# Patient Record
Sex: Female | Born: 1970 | Race: Black or African American | Hispanic: No | Smoking: Current every day smoker
Health system: Southern US, Community
[De-identification: ages and names within clinical notes are randomized; demographics above are authoritative.]

## PROBLEM LIST (undated history)

## (undated) DIAGNOSIS — R569 Unspecified convulsions: Secondary | ICD-10-CM

---

## 2008-12-10 ENCOUNTER — Emergency Department (HOSPITAL_COMMUNITY): Admission: EM | Admit: 2008-12-10 | Discharge: 2008-12-10 | Payer: Self-pay | Admitting: Emergency Medicine

## 2010-07-14 ENCOUNTER — Emergency Department (HOSPITAL_COMMUNITY)
Admission: EM | Admit: 2010-07-14 | Discharge: 2010-07-14 | Disposition: A | Discharge status: Home / Self Care | Payer: Self-pay | Attending: Emergency Medicine | Admitting: Emergency Medicine

## 2010-07-14 DIAGNOSIS — B9689 Other specified bacterial agents as the cause of diseases classified elsewhere: Secondary | ICD-10-CM | POA: Insufficient documentation

## 2010-07-14 DIAGNOSIS — R109 Unspecified abdominal pain: Secondary | ICD-10-CM | POA: Insufficient documentation

## 2010-07-14 DIAGNOSIS — N760 Acute vaginitis: Secondary | ICD-10-CM | POA: Insufficient documentation

## 2010-07-14 DIAGNOSIS — A499 Bacterial infection, unspecified: Secondary | ICD-10-CM | POA: Insufficient documentation

## 2010-07-14 LAB — URINALYSIS, ROUTINE W REFLEX MICROSCOPIC
Leukocytes, UA: NEGATIVE
Nitrite: NEGATIVE
Protein, ur: NEGATIVE mg/dL
Specific Gravity, Urine: 1.02 (ref 1.005–1.030)
Urobilinogen, UA: 0.2 mg/dL (ref 0.0–1.0)

## 2010-07-14 LAB — WET PREP, GENITAL: Trich, Wet Prep: NONE SEEN

## 2010-07-14 LAB — URINE MICROSCOPIC-ADD ON

## 2016-02-08 ENCOUNTER — Emergency Department (HOSPITAL_COMMUNITY)
Admission: EM | Admit: 2016-02-08 | Discharge: 2016-02-08 | Disposition: A | Discharge status: Home / Self Care | Payer: Self-pay | Attending: Emergency Medicine | Admitting: Emergency Medicine

## 2016-02-08 ENCOUNTER — Encounter (HOSPITAL_COMMUNITY): Payer: Self-pay | Admitting: Emergency Medicine

## 2016-02-08 ENCOUNTER — Emergency Department (HOSPITAL_COMMUNITY): Payer: Self-pay

## 2016-02-08 DIAGNOSIS — X58XXXA Exposure to other specified factors, initial encounter: Secondary | ICD-10-CM | POA: Insufficient documentation

## 2016-02-08 DIAGNOSIS — J219 Acute bronchiolitis, unspecified: Secondary | ICD-10-CM | POA: Insufficient documentation

## 2016-02-08 DIAGNOSIS — S93401A Sprain of unspecified ligament of right ankle, initial encounter: Secondary | ICD-10-CM | POA: Insufficient documentation

## 2016-02-08 DIAGNOSIS — Y999 Unspecified external cause status: Secondary | ICD-10-CM | POA: Insufficient documentation

## 2016-02-08 DIAGNOSIS — Y929 Unspecified place or not applicable: Secondary | ICD-10-CM | POA: Insufficient documentation

## 2016-02-08 DIAGNOSIS — F17210 Nicotine dependence, cigarettes, uncomplicated: Secondary | ICD-10-CM | POA: Insufficient documentation

## 2016-02-08 DIAGNOSIS — Y939 Activity, unspecified: Secondary | ICD-10-CM | POA: Insufficient documentation

## 2016-02-08 DIAGNOSIS — Z720 Tobacco use: Secondary | ICD-10-CM

## 2016-02-08 MED ORDER — ACETAMINOPHEN 325 MG PO TABS
650.0000 mg | ORAL_TABLET | Freq: Once | ORAL | Status: AC
  Administered 2016-02-08: 650 mg via ORAL
  Filled 2016-02-08: qty 2

## 2016-02-08 MED ORDER — PREDNISONE 10 MG PO TABS: 20.0000 mg | ORAL_TABLET | Freq: Two times a day (BID) | ORAL | 0 refills | Status: DC

## 2016-02-08 MED ORDER — AEROCHAMBER Z-STAT PLUS/MEDIUM MISC: 1.0000 | Freq: Once | Status: DC

## 2016-02-08 MED ORDER — PREDNISONE 10 MG PO TABS
60.0000 mg | ORAL_TABLET | Freq: Once | ORAL | Status: AC
  Administered 2016-02-08: 60 mg via ORAL
  Filled 2016-02-08: qty 1

## 2016-02-08 MED ORDER — IPRATROPIUM BROMIDE 0.02 % IN SOLN
0.5000 mg | Freq: Once | RESPIRATORY_TRACT | Status: AC
  Administered 2016-02-08: 0.5 mg via RESPIRATORY_TRACT
  Filled 2016-02-08: qty 2.5

## 2016-02-08 MED ORDER — DOXYCYCLINE HYCLATE 100 MG PO CAPS: 100.0000 mg | ORAL_CAPSULE | Freq: Two times a day (BID) | ORAL | 0 refills | Status: DC

## 2016-02-08 MED ORDER — ALBUTEROL SULFATE (2.5 MG/3ML) 0.083% IN NEBU
5.0000 mg | INHALATION_SOLUTION | Freq: Once | RESPIRATORY_TRACT | Status: AC
  Administered 2016-02-08: 5 mg via RESPIRATORY_TRACT
  Filled 2016-02-08: qty 6

## 2016-02-08 MED ORDER — ALBUTEROL SULFATE HFA 108 (90 BASE) MCG/ACT IN AERS
2.0000 | INHALATION_SPRAY | Freq: Once | RESPIRATORY_TRACT | Status: AC
  Administered 2016-02-08: 2 via RESPIRATORY_TRACT
  Filled 2016-02-08: qty 6.7

## 2016-02-08 NOTE — ED Notes (Signed)
Pt tolerating fluids at this time.  

## 2016-02-08 NOTE — ED Triage Notes (Signed)
Pt reports productive cough with thick yellowish sputum x 3 days. Pt also c/o headache and R foot pain and edema.

## 2016-02-08 NOTE — ED Notes (Signed)
Patient returned from X-ray 

## 2016-02-08 NOTE — ED Notes (Signed)
Pt complains of productive cough starting 3 days ago with yellow sputum, sneezing. Pt states a headache that started 1 day ago. Pt complains of R foot pain and swelling. Pt also complains of R great toe feeling numb.

## 2016-02-08 NOTE — Discharge Instructions (Signed)
Use the inhaler 2 puffs every 4 hours, for trouble breathing or cough.  It is important to stop smoking.  Follow-up with a primary care doctor if you are not better in one or 2 weeks.  Use the brace on your right ankle as needed for discomfort and swelling.

## 2016-02-08 NOTE — ED Provider Notes (Signed)
[facility]-EMERGENCY DEPT Provider Note   CSN: [id number] Arrival date & time: 02/08/16  1219  By signing my name below, I, Placido Sou, attest that this documentation has been prepared under the direction and in the presence of Mancel Bale, MD. Electronically Signed: Placido Sou, ED Scribe. 02/08/16. 3:55 PM.   History   Chief Complaint Chief Complaint  Patient presents with  . Cough    HPI HPI Comments: Mariah Bonilla is a 45 y.o. female who presents to the Emergency Department complaining of Cough for 2 weeks, productive of green tea L sputum. She denies fever, chills, nausea, vomiting, weakness or dizziness. She complains of swelling of the right ankle for several days without known trauma. She is a cigarette smoker. There are no other known modifying factors.    The history is provided by the patient. No language interpreter was used.    History reviewed. No pertinent past medical history.  There are no active problems to display for this patient.   History reviewed. No pertinent surgical history.  OB History    Gravida Para Term Preterm AB Living   2 2 2          SAB TAB Ectopic Multiple Live Births                   Home Medications    Prior to Admission medications   Medication Sig Start Date End Date Taking? Authorizing Provider  doxycycline (VIBRAMYCIN) 100 MG capsule Take 1 capsule (100 mg total) by mouth 2 (two) times daily. 02/08/16   Mancel Bale, MD  predniSONE (DELTASONE) 10 MG tablet Take 2 tablets (20 mg total) by mouth 2 (two) times daily. 02/08/16   Mancel Bale, MD    Family History Family History  Problem Relation Age of Onset  . Diabetes Other   . Cancer Other   . Hypertension Other     Social History Social History  Substance Use Topics  . Smoking status: Current Every Day Smoker    Packs/day: 0.25    Types: Cigarettes  . Smokeless tobacco: Never Used  . Alcohol use No     Allergies   Review of patient's allergies indicates  no known allergies.   Review of Systems Review of Systems  All other systems reviewed and are negative.  Physical Exam Updated Vital Signs BP 109/78   Pulse 87   Temp 98.8 F (37.1 C) (Oral)   Resp 20   Ht 5\' 2"  (1.575 m)   Wt 170 lb (77.1 kg)   LMP 02/08/2016   SpO2 99%   BMI 31.09 kg/m   Physical Exam  Constitutional: She is oriented to person, place, and time. She appears well-developed and well-nourished.  HENT:  Head: Normocephalic and atraumatic.  Eyes: Conjunctivae and EOM are normal. Pupils are equal, round, and reactive to light.  Neck: Normal range of motion and phonation normal. Neck supple.  Cardiovascular: Normal rate and regular rhythm.   Pulmonary/Chest: Effort normal. She exhibits no tenderness.  Decreased edema bilaterally with scattered rhonchi and wheezes. No increased work of breathing.  Abdominal: Soft. She exhibits no distension. There is no tenderness. There is no guarding.  Musculoskeletal: Normal range of motion.  Mild tenderness and swelling right lateral ankle region. Right ankle is stable to stress.  Neurological: She is alert and oriented to person, place, and time. She exhibits normal muscle tone.  Skin: Skin is warm and dry.  Psychiatric: She has a normal mood and affect. Her behavior is normal.  Judgment and thought content normal.  Nursing note and vitals reviewed.    ED Treatments / Results  Labs (all labs ordered are listed, but only abnormal results are displayed) Labs Reviewed - No data to display  EKG  EKG Interpretation None       Radiology Dg Chest 2 View  Result Date: 02/08/2016 CLINICAL DATA:  Productive cough for 3 days. EXAM: CHEST  2 VIEW COMPARISON:  None. FINDINGS: The heart size and mediastinal contours are within normal limits. Both lungs are clear. No pneumothorax or pleural effusion is noted. The visualized skeletal structures are unremarkable. IMPRESSION: No active cardiopulmonary disease. Electronically Signed    By: Lupita Raider, M.D.   On: 02/08/2016 13:55    Procedures Procedures  DIAGNOSTIC STUDIES: Oxygen Saturation is 93% on RA, adequate by my interpretation.    COORDINATION OF CARE: 3:55 PM Discussed next steps with pt. Pt verbalized understanding and is agreeable with the plan.    Medications Ordered in ED Medications  albuterol (PROVENTIL HFA;VENTOLIN HFA) 108 (90 Base) MCG/ACT inhaler 2 puff (not administered)  aerochamber Z-Stat Plus/medium 1 each (not administered)  albuterol (PROVENTIL) (2.5 MG/3ML) 0.083% nebulizer solution 5 mg (5 mg Nebulization Given 02/08/16 1354)  ipratropium (ATROVENT) nebulizer solution 0.5 mg (0.5 mg Nebulization Given 02/08/16 1354)  predniSONE (DELTASONE) tablet 60 mg (60 mg Oral Given 02/08/16 1323)  acetaminophen (TYLENOL) tablet 650 mg (650 mg Oral Given 02/08/16 1323)     Initial Impression / Assessment and Plan / ED Course  I have reviewed the triage vital signs and the nursing notes.  Pertinent labs & imaging results that were available during my care of the patient were reviewed by me and considered in my medical decision making (see chart for details).  Clinical Course   Medications  albuterol (PROVENTIL HFA;VENTOLIN HFA) 108 (90 Base) MCG/ACT inhaler 2 puff (not administered)  aerochamber Z-Stat Plus/medium 1 each (not administered)  albuterol (PROVENTIL) (2.5 MG/3ML) 0.083% nebulizer solution 5 mg (5 mg Nebulization Given 02/08/16 1354)  ipratropium (ATROVENT) nebulizer solution 0.5 mg (0.5 mg Nebulization Given 02/08/16 1354)  predniSONE (DELTASONE) tablet 60 mg (60 mg Oral Given 02/08/16 1323)  acetaminophen (TYLENOL) tablet 650 mg (650 mg Oral Given 02/08/16 1323)    Patient Vitals for the past 24 hrs:  BP Temp Temp src Pulse Resp SpO2 Height Weight  02/08/16 1400 109/78 - - 87 20 99 % - -  02/08/16 1355 - - - - - 100 % - -  02/08/16 1229 - - - - - - - 170 lb (77.1 kg)  02/08/16 1228 - - - - - - 5\' 2"  (1.575 m) -  02/08/16 1226  137/78 98.8 F (37.1 C) Oral 102 18 93 % - -    3:54 PM Reevaluation with update and discussion. After initial assessment and treatment, an updated evaluation reveals She is feeling better at this time. Findings discussed with patient and son, all questions answered. Caius Silbernagel L     I personally performed the services described in this documentation, which was scribed in my presence. The recorded information has been reviewed and is accurate   Final Clinical Impressions(s) / ED Diagnoses   Final diagnoses:  Acute bronchiolitis due to unspecified organism  Tobacco abuse  Right ankle sprain, initial encounter    Evaluation consistent with tobacco related, acute bronchitis. Doubt pneumonia, sepsis or PE. Incidental right ankle swelling likely related to occult strain.  Nursing Notes Reviewed/ Care Coordinated Applicable Imaging Reviewed Interpretation of Laboratory  Data incorporated into ED treatment  The patient appears reasonably screened and/or stabilized for discharge and I doubt any other medical condition or other [facility] requiring further screening, evaluation, or treatment in the ED at this time prior to discharge.  Plan: Home Medications- continue; Home Treatments- rest, stop smoking; return here if the recommended treatment, does not improve the symptoms; Recommended follow up- PCP prn   New Prescriptions New Prescriptions   DOXYCYCLINE (VIBRAMYCIN) 100 MG CAPSULE    Take 1 capsule (100 mg total) by mouth 2 (two) times daily.   PREDNISONE (DELTASONE) 10 MG TABLET    Take 2 tablets (20 mg total) by mouth 2 (two) times daily.     Mancel BaleElliott Jari Carollo, MD 02/08/16 [phone]

## 2016-02-09 NOTE — Progress Notes (Signed)
Late entry- Respiratory Therapist did instruction patient on the use of Albuterol MDI with aerochamber (dose was administered) at 1613 on 02/08/16. Patient was able to demonstrate her understanding of its use with good technique and effort.

## 2016-12-17 ENCOUNTER — Emergency Department (HOSPITAL_COMMUNITY)
Admission: EM | Admit: 2016-12-17 | Discharge: 2016-12-17 | Disposition: A | Discharge status: Home / Self Care | Payer: Self-pay | Attending: Emergency Medicine | Admitting: Emergency Medicine

## 2016-12-17 ENCOUNTER — Encounter (HOSPITAL_COMMUNITY): Payer: Self-pay | Admitting: Cardiology

## 2016-12-17 DIAGNOSIS — F17210 Nicotine dependence, cigarettes, uncomplicated: Secondary | ICD-10-CM | POA: Insufficient documentation

## 2016-12-17 DIAGNOSIS — Z5321 Procedure and treatment not carried out due to patient leaving prior to being seen by health care provider: Secondary | ICD-10-CM | POA: Insufficient documentation

## 2016-12-17 DIAGNOSIS — K1370 Unspecified lesions of oral mucosa: Secondary | ICD-10-CM | POA: Insufficient documentation

## 2016-12-17 NOTE — ED Triage Notes (Signed)
Sore under tongue since this morning.

## 2016-12-17 NOTE — ED Notes (Addendum)
Pt has purple blood vessel under tongue. Denies injury to pain at this time. States pain occurs when lifting tongue high in her mouth. Airway intact and open

## 2016-12-17 NOTE — ED Notes (Signed)
Per Anette RiedelAnn Hunt, RN pt left prior to d/c instructions given.

## 2017-09-21 ENCOUNTER — Emergency Department (HOSPITAL_COMMUNITY)
Admission: EM | Admit: 2017-09-21 | Discharge: 2017-09-21 | Disposition: A | Discharge status: Home / Self Care | Payer: BLUE CROSS/BLUE SHIELD | Attending: Emergency Medicine | Admitting: Emergency Medicine

## 2017-09-21 ENCOUNTER — Encounter (HOSPITAL_COMMUNITY): Payer: Self-pay | Admitting: Emergency Medicine

## 2017-09-21 DIAGNOSIS — K047 Periapical abscess without sinus: Secondary | ICD-10-CM | POA: Diagnosis not present

## 2017-09-21 DIAGNOSIS — K0889 Other specified disorders of teeth and supporting structures: Secondary | ICD-10-CM | POA: Diagnosis present

## 2017-09-21 DIAGNOSIS — F17210 Nicotine dependence, cigarettes, uncomplicated: Secondary | ICD-10-CM | POA: Insufficient documentation

## 2017-09-21 MED ORDER — CLINDAMYCIN HCL 150 MG PO CAPS
300.0000 mg | ORAL_CAPSULE | Freq: Once | ORAL | Status: AC
  Administered 2017-09-21: 300 mg via ORAL
  Filled 2017-09-21: qty 2

## 2017-09-21 MED ORDER — CLINDAMYCIN HCL 300 MG PO CAPS: 300.0000 mg | ORAL_CAPSULE | Freq: Four times a day (QID) | ORAL | 0 refills | Status: DC
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
## 2017-09-21 NOTE — Discharge Instructions (Addendum)
Return if any problems.

## 2017-09-21 NOTE — ED Provider Notes (Signed)
[facility] EMERGENCY DEPARTMENT Provider Note   CSN: [id number] Arrival date & time: 09/21/17  1647     History   Chief Complaint Chief Complaint  Patient presents with  . Dental Pain    HPI Mariah Bonilla is a 47 y.o. female.  The history is provided by the patient. No language interpreter was used.  Dental Pain   This is a new problem. The current episode started more than 2 days ago. The problem occurs constantly. The problem has been gradually worsening. The pain is at a severity of 4/10. The pain is moderate. She has tried nothing for the symptoms.  Pt complains of swelling to her lower jaw.  Pt reports area has been oozing today. Pt request antibiotics.    History reviewed. No pertinent past medical history.  There are no active problems to display for this patient.   History reviewed. No pertinent surgical history.   OB History    Gravida  2   Para  2   Term  2   Preterm      AB      Living        SAB      TAB      Ectopic      Multiple      Live Births               Home Medications    Prior to Admission medications   Medication Sig Start Date End Date Taking? Authorizing Provider  clindamycin (CLEOCIN) 300 MG capsule Take 1 capsule (300 mg total) by mouth every 6 (six) hours. 09/21/17   Elson Areas, PA-C  doxycycline (VIBRAMYCIN) 100 MG capsule Take 1 capsule (100 mg total) by mouth 2 (two) times daily. 02/08/16   Mancel Bale, MD  predniSONE (DELTASONE) 10 MG tablet Take 2 tablets (20 mg total) by mouth 2 (two) times daily. 02/08/16   Mancel Bale, MD    Family History Family History  Problem Relation Age of Onset  . Diabetes Other   . Cancer Other   . Hypertension Other     Social History Social History   Tobacco Use  . Smoking status: Current Every Day Smoker    Packs/day: 0.25    Types: Cigarettes  . Smokeless tobacco: Never Used  Substance Use Topics  . Alcohol use: Yes    Comment: rarely  . Drug use: No      Allergies   Patient has no known allergies.   Review of Systems Review of Systems  All other systems reviewed and are negative.    Physical Exam Updated Vital Signs BP (!) 146/97 (BP Location: Right Arm)   Pulse (!) 103   Temp 98.8 F (37.1 C) (Oral)   Resp 16   Ht  (1.6 m)   Wt 62.6 kg (138 lb)   LMP 09/07/2017   SpO2 100%   BMI 24.45 kg/m   Physical Exam  Constitutional: She appears well-developed and well-nourished.  HENT:  Head: Normocephalic.  Swelling lower gumline,   Cardiovascular: Normal rate.  Pulmonary/Chest: Effort normal.  Musculoskeletal: Normal range of motion.  Neurological: She is alert.  Skin: Skin is warm.  Psychiatric: She has a normal mood and affect.  Nursing note and vitals reviewed.    ED Treatments / Results  Labs (all labs ordered are listed, but only abnormal results are displayed) Labs Reviewed - No data to display  EKG None  Radiology No results found.  Procedures Procedures (  including critical care time)  Medications Ordered in ED Medications  clindamycin (CLEOCIN) capsule 300 mg (300 mg Oral Given 09/21/17 1934)     Initial Impression / Assessment and Plan / ED Course  I have reviewed the triage vital signs and the nursing notes.  Pertinent labs & imaging results that were available during my care of the patient were reviewed by me and considered in my medical decision making (see chart for details).    Pt given resource guide for dentistry.     Final Clinical Impressions(s) / ED Diagnoses   Final diagnoses:  Dental infection    ED Discharge Orders        Ordered    clindamycin (CLEOCIN) 300 MG capsule  Every 6 hours     09/21/17 1926    An After Visit Summary was printed and given to the patient.   Elson Areas, Cordelia Poche 09/21/17 2001    Doug Sou, MD 09/22/17 [phone]

## 2018-03-05 IMAGING — DX DG CHEST 2V
2 series · 2 of 2 positions shown · non-contrast
Comparison: None.

CLINICAL DATA: Productive cough for 3 days.

EXAM:
CHEST  2 VIEW

[chest pa]
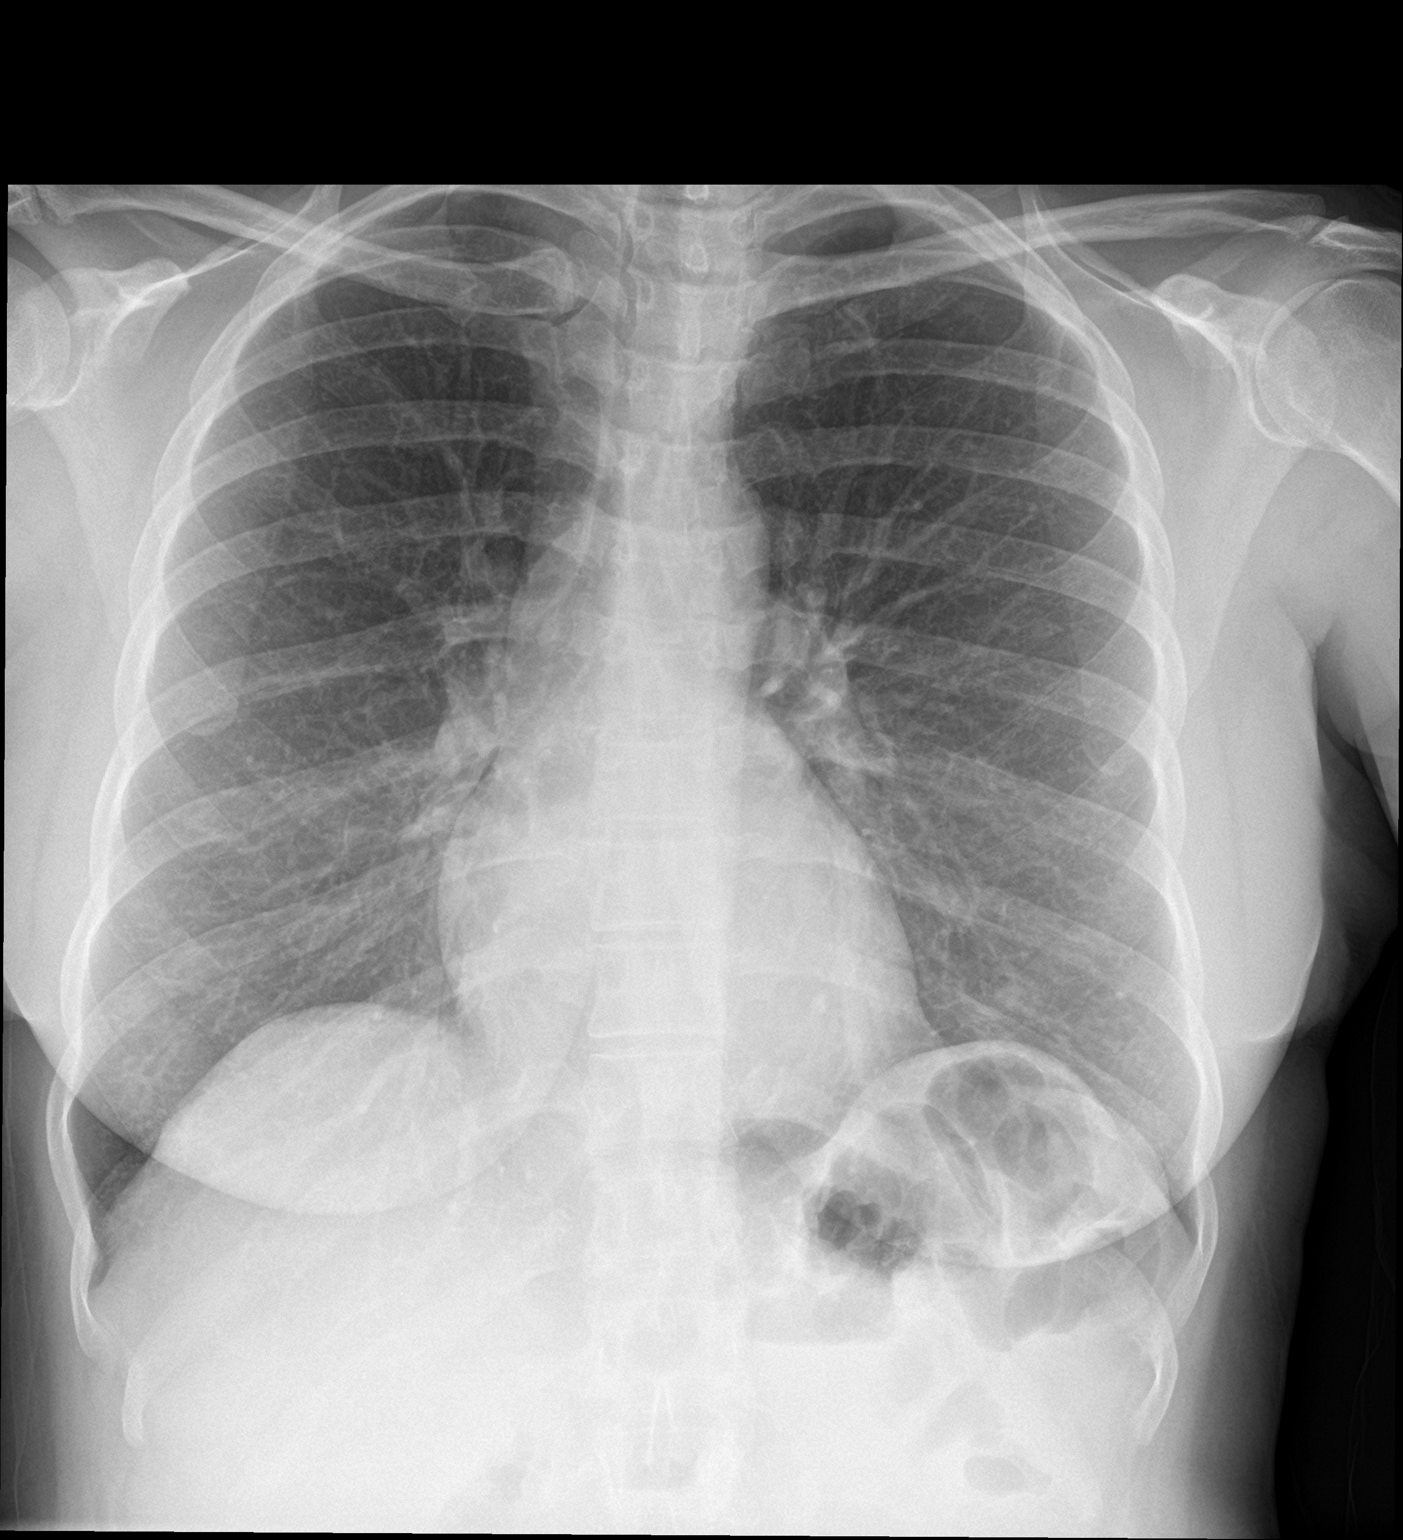

[chest lat]
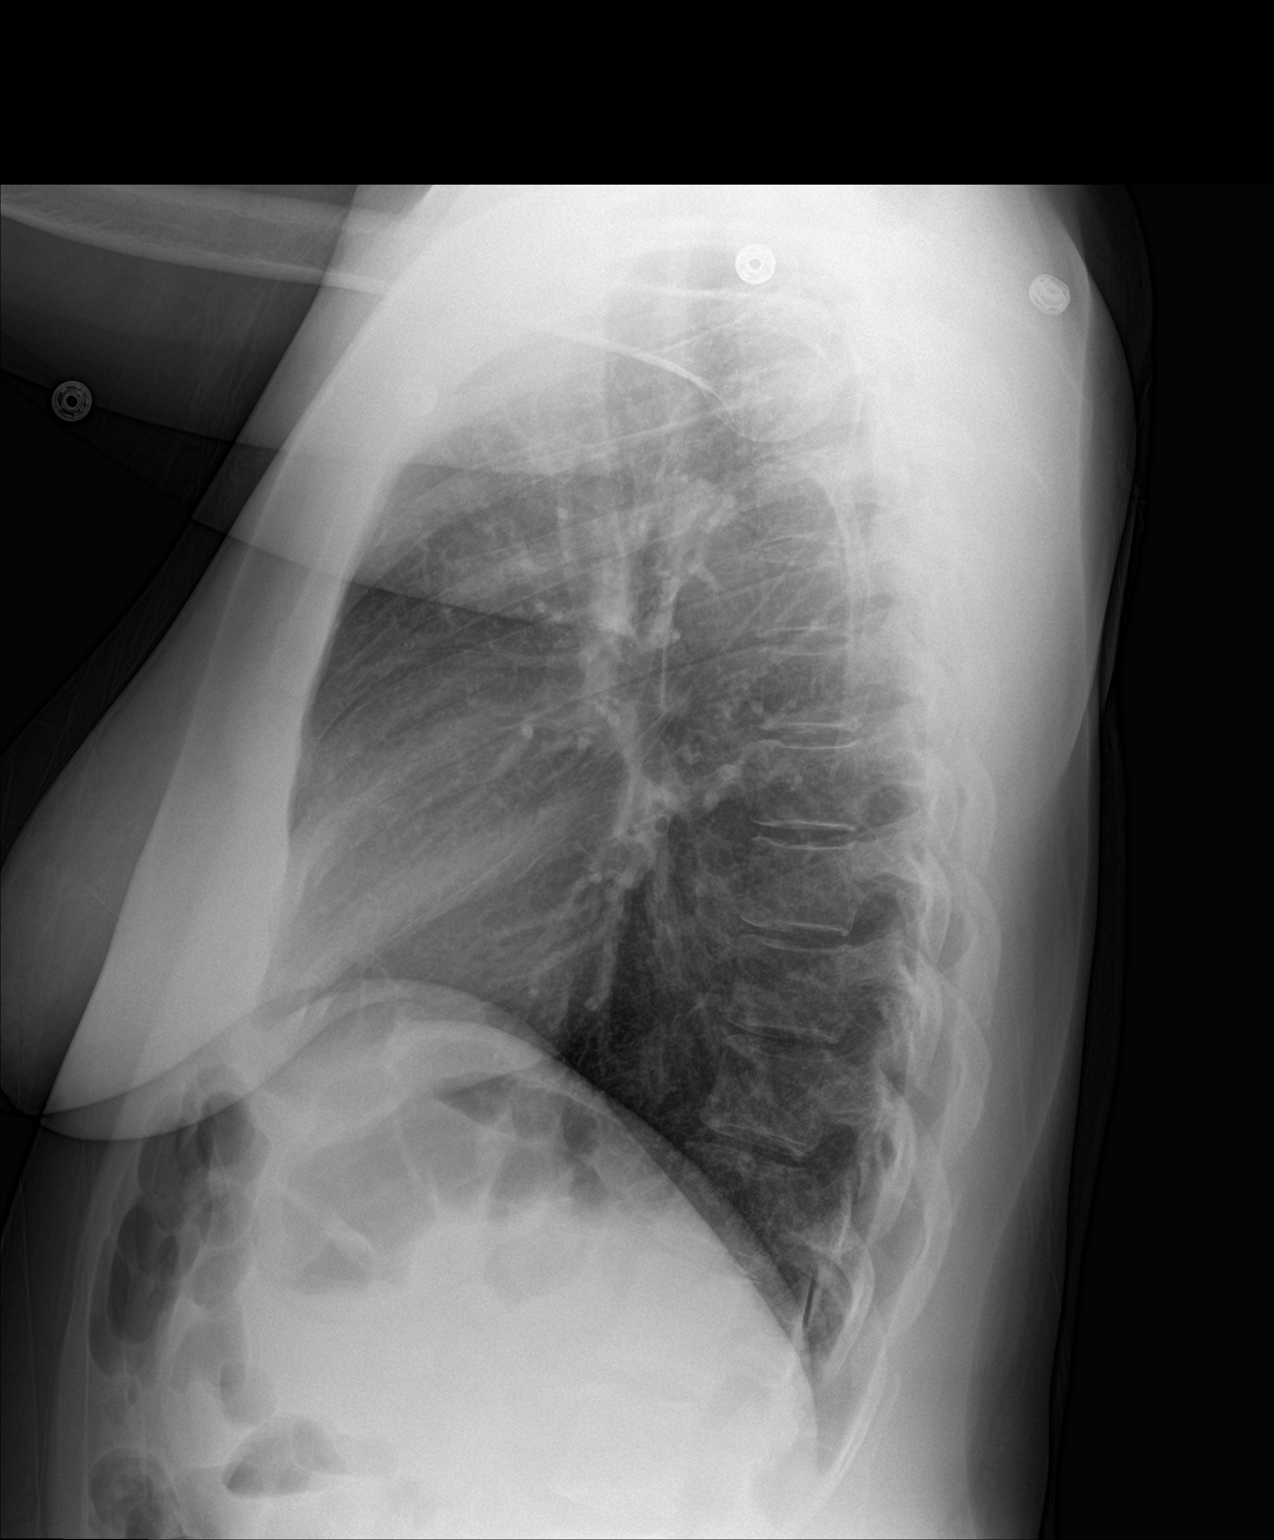

[2 of 2 positions shown; findings below may reference images not displayed]

FINDINGS: The heart size and mediastinal contours are within normal limits.
Both lungs are clear. No pneumothorax or pleural effusion is noted.
The visualized skeletal structures are unremarkable.
IMPRESSION: No active cardiopulmonary disease.

## 2023-09-28 ENCOUNTER — Ambulatory Visit: Admitting: Neurology

## 2023-09-28 ENCOUNTER — Encounter: Payer: Self-pay | Admitting: Neurology

## 2023-09-28 VITALS — BP 98/65 | HR 91 | Ht 64.5 in | Wt 124.5 lb

## 2023-09-28 DIAGNOSIS — G40909 Epilepsy, unspecified, not intractable, without status epilepticus: Secondary | ICD-10-CM | POA: Insufficient documentation

## 2023-09-28 MED ORDER — DIVALPROEX SODIUM ER 500 MG PO TB24: 500.0000 mg | ORAL_TABLET | Freq: Every day | ORAL | 11 refills | Status: DC

## 2023-09-28 MED ORDER — LEVETIRACETAM 500 MG PO TABS: 500.0000 mg | ORAL_TABLET | Freq: Two times a day (BID) | ORAL | 11 refills | Status: DC

## 2023-09-28 MED ORDER — DIVALPROEX SODIUM ER 500 MG PO TB24: 500.0000 mg | ORAL_TABLET | Freq: Every day | ORAL | 11 refills | Status: AC

## 2023-09-28 NOTE — Progress Notes (Signed)
 Chief Complaint  Patient presents with   New Patient (Initial Visit)    Rm14, friend Mariah Bonilla present, referral for Seizure/Mariah Suszanne Eriksson, PA-[facility] [phone]: last sz a week ago today but hadn't had any before that since was little girl.       ASSESSMENT AND PLAN  Mariah Bonilla is a 53 y.o. 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   MEDICAL HISTORY:  Mariah Bonilla, is a 53 year old female seen in request by [facility] Dr. Lauran Pollard, MD for evaluation of seizure, initial evaluation was on Sep 28, 2023 with her friend Mariah Bonilla    History is obtained from the patient and review of electronic medical records. I personally reviewed pertinent available imaging films in PACS.   PMHx of   She works at a group home, dayshift 8 AM to 8 PM, on May 12, when she got up, did not feel well, when she To her group home, she complains of nauseous, then went to recliner, woke up at the hospital, per witnesses, she had seizure activity, with tongue biting, ambulance was called, was treated at [facility], CT head without contrast showed no acute abnormality  Laboratory evaluation showed negative alcohol, drug screen was positive for marijuana, elevated CPK 542, hemoglobin of 16, WBC was 11.7, normal CMP, elevated lactic acid 2.3  She had a history of seizure when she was a teenager, frequent staring spells was treated  with ethosuximide, but never had generalized seizure, has stopped taking medication for many years, still has occasionally episode of gazed off,   She does suffer depression anxiety, drinks small amount of beer regularly, uses marijuana occasionally  She was started on Keppra  500 mg twice a day, do have angry outburst, have a lot of stress  PHYSICAL EXAM:   Vitals:   09/28/23 1459  BP: 98/65  Pulse: 91  Weight: 124 lb 8 oz (56.5 kg)  Height: 5' 4.5" (1.638 m)   Not recorded     Body mass index is 21.04 kg/m.  PHYSICAL EXAMNIATION:  Gen: NAD, conversant, well nourised, well groomed                     Cardiovascular: Regular rate rhythm, no peripheral edema, warm, nontender. Eyes: Conjunctivae clear without exudates or hemorrhage Neck: Supple, no carotid bruits. Pulmonary: Clear to auscultation bilaterally   NEUROLOGICAL EXAM:  MENTAL STATUS: Speech/cognition: Awake, alert, oriented to history taking and casual conversation CRANIAL NERVES: CN II: Visual fields are full to confrontation. Pupils are round equal and briskly reactive to light. CN III, IV, VI: extraocular movement are normal. No ptosis. CN V: Facial sensation is intact to light touch CN VII: Face is symmetric with normal eye closure  CN VIII: Hearing is normal to causal conversation. CN IX, X: Phonation is normal. CN XI: Head turning and shoulder shrug are intact  MOTOR: There is no pronator drift  of out-stretched arms. Muscle bulk and tone are normal. Muscle strength is normal.  REFLEXES: Reflexes are 2+ and symmetric at the biceps, triceps, knees, and ankles. Plantar responses are flexor.  SENSORY: Intact to light touch, pinprick and vibratory sensation are intact in fingers and toes.  COORDINATION: There is no trunk or limb dysmetria noted.  GAIT/STANCE: Posture is normal. Gait is steady with normal steps, base, arm swing, and turning. Heel and toe walking are normal. Tandem gait is normal.   Romberg is absent.  REVIEW OF SYSTEMS:  Full 14 system review of systems performed and notable only for as above All other review of systems were negative.   ALLERGIES: No Known Allergies  HOME MEDICATIONS: Current Outpatient Medications  Medication Sig Dispense Refill   levETIRAcetam  (KEPPRA ) 500 MG tablet Take 500 mg by mouth 2 (two) times daily.     No current facility-administered medications for this visit.    PAST MEDICAL HISTORY: History reviewed. No pertinent past medical history.  PAST SURGICAL HISTORY: History reviewed. No pertinent surgical history.  FAMILY HISTORY: Family History  Problem Relation Age of Onset   Diabetes Other    Cancer Other    Hypertension Other     SOCIAL HISTORY: Social History   Socioeconomic History   Marital status: Legally Separated    Spouse name: Not on file   Number of children: Not on file   Years of education: Not on file   Highest education level: Not on file  Occupational History   Not on file  Tobacco Use   Smoking status: Every Day    Current packs/day: 0.25    Types: Cigarettes   Smokeless tobacco: Never  Substance and Sexual Activity   Alcohol use: Yes    Comment: rarely   Drug use: No   Sexual activity: Not on file  Other Topics Concern   Not on file  Social History Narrative   Not on file   Social Drivers of Health   Financial Resource Strain: Not on file  Food Insecurity: Not on file  Transportation Needs: Not on file  Physical Activity: Not on file  Stress: Not on file  Social Connections: Not on file  Intimate Partner Violence: Not on file      Mariah Bonilla, M.D. Ph.D.  [facility] [address], [address] [place], [place] [address] Ph: [phone] Fax: [phone]  CC:  Mariah Bastos, PA-[facility] [address] [place],  [place] [address]  Mariah Bonilla, Mariah Bonilla

## 2023-10-07 ENCOUNTER — Ambulatory Visit: Admitting: Neurology

## 2023-10-07 DIAGNOSIS — G40909 Epilepsy, unspecified, not intractable, without status epilepticus: Secondary | ICD-10-CM | POA: Diagnosis not present

## 2023-10-09 ENCOUNTER — Telehealth: Payer: Self-pay | Admitting: Neurology

## 2023-10-09 NOTE — Telephone Encounter (Signed)
 [employer] medicaid [employer]: [id number] exp. 10/09/23-11/23/23 sent to [facility] [phone]

## 2023-10-16 ENCOUNTER — Ambulatory Visit: Payer: Self-pay | Admitting: Neurology

## 2023-10-16 NOTE — Procedures (Signed)
   HISTORY: 53 years old female with history of seizure  TECHNIQUE:  This is a routine 16 channel EEG recording with one channel devoted to a limited EKG recording.  It was performed during wakefulness, drowsiness and asleep.  Hyperventilation and photic stimulation were performed as activating procedures.  There are minimum muscle and movement artifact noted.  Upon maximum arousal, posterior dominant waking rhythm consistent of rhythmic alpha range activity. Activities are symmetric over the bilateral posterior derivations and attenuated with eye opening.  Photic stimulation did not alter the tracing.  Hyperventilation produced mild/moderate buildup with higher amplitude and the slower activities noted.  During EEG recording, patient developed drowsiness and no deeper stage of sleep was achieved  During EEG recording, there was no epileptiform discharge noted.  EKG demonstrate normal sinus rhythm.  CONCLUSION: This is a  normal awake EEG.  There is no electrodiagnostic evidence of epileptiform discharge.  Sallye Lunz, M.D. Ph.D.  [facility] [address] [place], [place] [address] Phone: [phone] Fax:      [phone]

## 2023-11-11 ENCOUNTER — Other Ambulatory Visit: Payer: Self-pay

## 2024-02-12 ENCOUNTER — Emergency Department (HOSPITAL_COMMUNITY)
Admission: EM | Admit: 2024-02-12 | Discharge: 2024-02-12 | Disposition: A | Discharge status: Home / Self Care | Payer: Self-pay | Attending: Emergency Medicine | Admitting: Emergency Medicine

## 2024-02-12 ENCOUNTER — Other Ambulatory Visit: Payer: Self-pay

## 2024-02-12 ENCOUNTER — Encounter (HOSPITAL_COMMUNITY): Payer: Self-pay

## 2024-02-12 DIAGNOSIS — K047 Periapical abscess without sinus: Secondary | ICD-10-CM | POA: Insufficient documentation

## 2024-02-12 HISTORY — DX: Unspecified convulsions: R56.9

## 2024-02-12 MED ORDER — HYDROCODONE-ACETAMINOPHEN 5-325 MG PO TABS: 1.0000 | ORAL_TABLET | Freq: Four times a day (QID) | ORAL | 0 refills | Status: AC | PRN

## 2024-02-12 MED ORDER — CLINDAMYCIN HCL 300 MG PO CAPS: 300.0000 mg | ORAL_CAPSULE | Freq: Four times a day (QID) | ORAL | 0 refills | Status: AC

## 2024-02-12 MED ORDER — NAPROXEN 375 MG PO TABS: 375.0000 mg | ORAL_TABLET | Freq: Two times a day (BID) | ORAL | 0 refills | Status: AC

## 2024-02-12 NOTE — ED Provider Notes (Signed)
 [facility] AT [facility] Provider Note   CSN: [id number] Arrival date & time: 02/12/24  1014     Patient presents with: Abscess   Mariah Bonilla is a 53 y.o. female.   Patient is a 53 year old female who presents emergency department the chief complaint of swelling along the right lower gumline which has been present for at least the past week.  She has been seen previously for this and prescribed amoxicillin as well as tramadol.  She does have a history of seizures and cannot take tramadol.  Patient notes that she does have a dentist appointment scheduled but it is in November.  Patient notes that she has had no associated stridor, dysphagia, drooling, dyspnea.  She has had no associated fever or chills.   Abscess      Prior to Admission medications   Medication Sig Start Date End Date Taking? Authorizing Provider  divalproex  (DEPAKOTE  ER) 500 MG 24 hr tablet Take 1 tablet (500 mg total) by mouth at bedtime. 09/28/23   Onita Duos, MD    Allergies: Patient has no known allergies.    Review of Systems  HENT:  Positive for dental problem.   All other systems reviewed and are negative.   Updated Vital Signs BP (!) 139/96   Pulse 87   Temp 98.3 F (36.8 C) (Oral)   Resp 17   Ht 5' 7 (1.702 m)   Wt 62.6 kg   SpO2 100%   BMI 21.61 kg/m   Physical Exam Vitals and nursing note reviewed.  Constitutional:      General: She is not in acute distress.    Appearance: Normal appearance. She is not ill-appearing.  HENT:     Head: Normocephalic and atraumatic.     Nose: Nose normal.     Mouth/Throat:     Mouth: Mucous membranes are moist.     Comments: Swelling noted to the right lower gumline, no areas of obvious fluctuance or induration, floor mouth is soft, tolerant secretions without difficulty, no peritonsillar swelling Eyes:     Extraocular Movements: Extraocular movements intact.     Conjunctiva/sclera: Conjunctivae normal.      Pupils: Pupils are equal, round, and reactive to light.  Cardiovascular:     Rate and Rhythm: Normal rate and regular rhythm.     Pulses: Normal pulses.     Heart sounds: Normal heart sounds. No murmur heard.    No gallop.  Pulmonary:     Effort: Pulmonary effort is normal. No respiratory distress.     Breath sounds: Normal breath sounds. No stridor. No wheezing, rhonchi or rales.  Musculoskeletal:        General: Normal range of motion.     Cervical back: Normal range of motion and neck supple. No rigidity or tenderness.  Lymphadenopathy:     Cervical: No cervical adenopathy.  Skin:    General: Skin is warm and dry.  Neurological:     General: No focal deficit present.     Mental Status: She is alert and oriented to person, place, and time. Mental status is at baseline.  Psychiatric:        Mood and Affect: Mood normal.        Behavior: Behavior normal.        Thought Content: Thought content normal.        Judgment: Judgment normal.     (all labs ordered are listed, but only abnormal results are displayed) Labs Reviewed - No  data to display  EKG: None  Radiology: No results found.   Procedures   Medications Ordered in the ED - No data to display                                  Medical Decision Making Patient is doing well at this time and is stable for discharge home.  Discussed with patient we will treat her for dental infection at this time and give her something else for pain.  Patient has no signs of an obvious drainable abscess on exam.  She does have focal swelling to the lower gumline with no areas of fluctuance or induration.  She has no indication for acute peritonsillar abscess, Ludwig's angina, retropharyngeal abscess, epiglottitis.  Vital signs are stable with no indication for sepsis.  She has no signs of acute airway compromise.  Strict return precautions were provided for any new or worsening symptoms as well as importance of close follow-up with a  dentist as scheduled.  Patient voiced understanding to the plan and had no additional questions.        Final diagnoses:  None    ED Discharge Orders     None          Daralene Lonni JONETTA DEVONNA 02/12/24 1102    Towana Ozell BROCKS, MD 02/12/24 1719

## 2024-02-12 NOTE — ED Triage Notes (Signed)
 Pt c/o right side bottom abscess d/t unable to get to a dentist. Pt has broken tooth.

## 2024-02-12 NOTE — Discharge Instructions (Signed)
 Please take all antibiotics as directed.  Follow-up closely with your dentist on an outpatient basis.  Additional resources have been provided.  Return to emergency department immediately for any new or worsening symptoms.

## 2024-02-12 NOTE — ED Notes (Signed)
 This pt was cared for by a different nurse

## 2024-03-16 ENCOUNTER — Ambulatory Visit: Payer: Self-pay | Admitting: Neurology
